# Patient Record
Sex: Male | Born: 1970
Health system: Southern US, Community
[De-identification: ages and names within clinical notes are randomized; demographics above are authoritative.]

## PROBLEM LIST (undated history)

## (undated) DIAGNOSIS — N200 Calculus of kidney: Secondary | ICD-10-CM

## (undated) HISTORY — DX: Calculus of kidney: N20.0

---
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
## 2007-08-24 ENCOUNTER — Ambulatory Visit: Payer: Self-pay | Admitting: Family Medicine

## 2007-12-15 HISTORY — PX: VASECTOMY: SHX75

## 2011-01-22 ENCOUNTER — Encounter: Payer: Self-pay | Admitting: Specialist

## 2011-02-12 ENCOUNTER — Encounter: Payer: Self-pay | Admitting: Specialist

## 2012-12-21 ENCOUNTER — Ambulatory Visit: Payer: Self-pay | Admitting: Specialist

## 2014-09-23 ENCOUNTER — Emergency Department: Payer: Self-pay | Admitting: Emergency Medicine

## 2015-05-15 ENCOUNTER — Encounter: Payer: Self-pay | Admitting: Family Medicine

## 2015-05-15 ENCOUNTER — Ambulatory Visit (INDEPENDENT_AMBULATORY_CARE_PROVIDER_SITE_OTHER): Payer: PRIVATE HEALTH INSURANCE | Admitting: Family Medicine

## 2015-05-15 VITALS — BP 118/78 | HR 83 | Temp 98.5°F | Resp 16 | Wt 197.6 lb

## 2015-05-15 DIAGNOSIS — N200 Calculus of kidney: Secondary | ICD-10-CM | POA: Insufficient documentation

## 2015-05-15 DIAGNOSIS — J029 Acute pharyngitis, unspecified: Secondary | ICD-10-CM | POA: Diagnosis not present

## 2015-05-15 DIAGNOSIS — G4700 Insomnia, unspecified: Secondary | ICD-10-CM | POA: Insufficient documentation

## 2015-05-15 DIAGNOSIS — D179 Benign lipomatous neoplasm, unspecified: Secondary | ICD-10-CM

## 2015-05-15 DIAGNOSIS — T148XXA Other injury of unspecified body region, initial encounter: Secondary | ICD-10-CM | POA: Insufficient documentation

## 2015-05-15 LAB — POCT RAPID STREP A (OFFICE): Rapid Strep A Screen: NEGATIVE

## 2015-05-15 NOTE — Progress Notes (Signed)
   Patient: Johnny Colon, Male    DOB: 11-15-1971, 44 y.o.   MRN: [id number] Visit Date: 05/15/2015  Today's Provider: Lelon Huh, MD   Chief Complaint  Patient presents with  . Sore Throat    yesterday am  . Mass    left arm, 2-3 weeks ago   Subjective:    Sore Throat  This is a new problem. The current episode started yesterday. The problem has been gradually worsening. The pain is worse on the left side. There has been no fever. The pain is mild. Associated symptoms include neck pain, swollen glands and vomiting (now resolved). Pertinent negatives include no abdominal pain, congestion, coughing, diarrhea, ear pain, headaches, hoarse voice or shortness of breath.   Lump on arm He states he hit his left arm on object a few weeks ago. Has been a little sore since then, but knot has formed in area.     Previous Medications   RANITIDINE (ZANTAC) 150 MG TABLET    Take 1 tablet by mouth 2 (two) times daily.   ZOLPIDEM (AMBIEN) 10 MG TABLET    Take 1 tablet by mouth at bedtime. As needed for Insomnia    Review of Systems  HENT: Negative for congestion, ear pain and hoarse voice.   Respiratory: Negative for cough and shortness of breath.   Gastrointestinal: Positive for vomiting (now resolved). Negative for abdominal pain and diarrhea.  Musculoskeletal: Positive for neck pain.  Neurological: Negative for headaches.    History  Substance Use Topics  . Smoking status: Former Smoker    Types: Cigarettes  . Smokeless tobacco: Not on file  . Alcohol Use: Yes     Comment: occasional use   Objective:    Filed Vitals:   05/15/15 1508  BP: 118/78  Pulse: 83  Temp: 98.5 F (36.9 C)  TempSrc: Oral  Resp: 16  Weight: 197 lb 9.6 oz (89.631 kg)  SpO2: 97%   Physical Exam  Constitutional: He appears well-developed and well-nourished.  HENT:  Nose: No rhinorrhea, sinus tenderness or nasal deformity. No epistaxis. Right sinus exhibits no maxillary sinus tenderness and no  frontal sinus tenderness. Left sinus exhibits no maxillary sinus tenderness and no frontal sinus tenderness.  Mouth/Throat: Posterior oropharyngeal erythema (left more than right) present. No oropharyngeal exudate, posterior oropharyngeal edema or tonsillar abscesses.   Results for orders placed or performed in visit on 05/15/15  POCT rapid strep A  Result Value Ref Range   Rapid Strep A Screen Negative Negative        Assessment & Plan:     1. Sore throat  Likely viral. Recommend salt water gargles and OTC ibuprofen as needed.  - POCT rapid strep A - Culture, Group A Strep

## 2015-05-16 ENCOUNTER — Encounter: Payer: Self-pay | Admitting: Family Medicine

## 2015-05-17 ENCOUNTER — Telehealth: Payer: Self-pay

## 2015-05-17 LAB — CULTURE, GROUP A STREP: Strep A Culture: NEGATIVE

## 2015-05-17 NOTE — Telephone Encounter (Signed)
Advised patient that he can stop taking antibiotic due to culture being negative.

## 2015-05-17 NOTE — Telephone Encounter (Signed)
-----   Message from Birdie Sons, MD sent at 05/17/2015 12:36 PM EDT ----- Please advise patient that strep culture is negative and there is no need to take an antibiotic

## 2015-05-30 ENCOUNTER — Telehealth: Payer: Self-pay | Admitting: Family Medicine

## 2015-05-30 NOTE — Telephone Encounter (Signed)
Pt is requesting a Rx to help with chest congestion if possible due to seeing you last week.  [employer].  [phone]

## 2015-05-30 NOTE — Telephone Encounter (Signed)
Please advise 

## 2015-06-02 NOTE — Telephone Encounter (Signed)
Best thing for chest congestion is OTC mucinex. If cough is getting worse, having any fever, or shortness of breath then need to return for follow up.

## 2015-06-03 NOTE — Telephone Encounter (Signed)
Pt advised as directed below.  He reports going [facility] over the weekend and they prescribed them a Prednisone tape.  I advised him to give [place] a call if he does not improve.  Thanks,   -Mickel Baas

## 2015-08-22 ENCOUNTER — Ambulatory Visit (INDEPENDENT_AMBULATORY_CARE_PROVIDER_SITE_OTHER): Payer: PRIVATE HEALTH INSURANCE | Admitting: Family Medicine

## 2015-08-22 ENCOUNTER — Other Ambulatory Visit: Payer: Self-pay | Admitting: Family Medicine

## 2015-08-22 ENCOUNTER — Encounter: Payer: Self-pay | Admitting: Family Medicine

## 2015-08-22 ENCOUNTER — Ambulatory Visit
Admission: RE | Admit: 2015-08-22 | Discharge: 2015-08-22 | Disposition: A | Discharge status: Home / Self Care | Payer: 59 | Source: Ambulatory Visit | Attending: Family Medicine | Admitting: Family Medicine

## 2015-08-22 ENCOUNTER — Telehealth: Payer: Self-pay

## 2015-08-22 VITALS — BP 120/70 | HR 71 | Temp 98.7°F | Resp 16 | Wt 197.0 lb

## 2015-08-22 DIAGNOSIS — T148XXA Other injury of unspecified body region, initial encounter: Secondary | ICD-10-CM

## 2015-08-22 DIAGNOSIS — R0781 Pleurodynia: Secondary | ICD-10-CM | POA: Diagnosis not present

## 2015-08-22 MED ORDER — DICLOFENAC SODIUM 50 MG PO TBEC: 50.0000 mg | DELAYED_RELEASE_TABLET | Freq: Three times a day (TID) | ORAL | Status: DC | PRN

## 2015-08-22 NOTE — Progress Notes (Signed)
       Patient: Johnny Colon Male    DOB: 03-Dec-1971   44 y.o.   MRN: [id number] Visit Date: 08/22/2015  Today's Provider: Lelon Huh, MD   Chief Complaint  Patient presents with  . Rib Injury   Subjective:    HPI  Pain in left side since Tuesday 08/20/2015. Has sob, he said that it hurts to breath in. No injury. Had rough some rough play Monday (08/19/2015) afternoon.  Allergies  Allergen Reactions  . Penicillins Anaphylaxis   Previous Medications   RANITIDINE (ZANTAC) 150 MG TABLET    Take 1 tablet by mouth 2 (two) times daily.   ZOLPIDEM (AMBIEN) 10 MG TABLET    Take 1 tablet by mouth at bedtime. As needed for Insomnia    Review of Systems  Respiratory: Positive for chest tightness and shortness of breath.   Cardiovascular: Positive for chest pain. Negative for palpitations.  Musculoskeletal: Positive for back pain.  Neurological: Positive for dizziness. Negative for headaches.    Social History  Substance Use Topics  . Smoking status: Former Smoker    Types: Cigarettes  . Smokeless tobacco: Not on file  . Alcohol Use: Yes     Comment: occasional use   Objective:   BP 120/70 mmHg  Pulse 71  Temp(Src) 98.7 F (37.1 C) (Oral)  Resp 16  Wt 197 lb (89.359 kg)  SpO2 97%  Physical Exam  Moderately tender left lateral lower ribs. Slight swelling. Lungs CTA. No discoloration XRay negative.      Assessment & Plan:     1. Rib pain on left side  - DG Ribs Unilateral Left; Future  Sx x 3 days with no sign of fracture on xrays. Likely intercostal muscle strain. Continue intermittent ice for the next 24 hours. Call in rx diclofenac. Call if not steadily improving.       Lelon Huh, MD  [facility]

## 2015-08-22 NOTE — Telephone Encounter (Signed)
Patient advised as below and verbally voiced understanding. Prescription sent into pharmacy.

## 2015-08-22 NOTE — Telephone Encounter (Signed)
-----   Message from Birdie Sons, MD sent at 08/22/2015  2:16 PM EDT ----- Regarding: Rib Xray I don't see any rib fractures. Most likely strain of tear of intercostal muscles. Recommend rx for diclofenac 50mg  one tablet every eight hours as needed for pain, #30 rf x 0.   and apply ice every 4-6 hours today and tomorrow.  Should have report from radiologist by tomorrow.

## 2015-08-22 NOTE — Telephone Encounter (Signed)
Spoke with Dr. Caryn Section. Prescription has been sent into pharmacy to help with pain.

## 2015-08-22 NOTE — Telephone Encounter (Signed)
Pt was seen this morning with Dr. Caryn Section and was waiting on the xray results. Pt is asking for something to help the pain of his possible broken rib since Dr. Caryn Section is out of the office this afternoon. Please advise. Thanks TNP

## 2015-08-23 ENCOUNTER — Telehealth: Payer: Self-pay

## 2015-08-23 MED ORDER — HYDROCODONE-ACETAMINOPHEN 7.5-325 MG PO TABS: 1.0000 | ORAL_TABLET | Freq: Four times a day (QID) | ORAL | Status: DC | PRN

## 2015-08-23 NOTE — Telephone Encounter (Signed)
No fracture. Pain should steadily improve over the weekend, but may take about 2 weeks to completely resolve.

## 2015-08-23 NOTE — Telephone Encounter (Signed)
Patient called back wanting to know if we received the report from imaging from what he understood yesterday we were still waiting for that? Please review, patient also wants to know how long this pain will last, thank you-aa

## 2015-08-23 NOTE — Telephone Encounter (Signed)
rx for diclofenac was sent to pharrmacy earlier today. Can also have rx for hydrocodone which has been printed up, but he has to pick up prescription, and should not take this during the day because it causes drowsiness.

## 2015-08-23 NOTE — Telephone Encounter (Signed)
Patient notified

## 2015-08-23 NOTE — Telephone Encounter (Signed)
Patient notified. Patient requested an rx to help with pain?

## 2015-08-27 ENCOUNTER — Other Ambulatory Visit: Payer: Self-pay | Admitting: Family Medicine

## 2015-08-27 NOTE — Telephone Encounter (Signed)
Rx called in to pharmacy. 

## 2015-08-27 NOTE — Telephone Encounter (Signed)
Please call in zolpidem  

## 2015-12-22 ENCOUNTER — Other Ambulatory Visit: Payer: Self-pay | Admitting: Family Medicine

## 2015-12-23 ENCOUNTER — Other Ambulatory Visit: Payer: Self-pay | Admitting: Family Medicine

## 2015-12-23 NOTE — Telephone Encounter (Signed)
Please call in zolpidem  

## 2015-12-24 NOTE — Telephone Encounter (Signed)
Rx called in to pharmacy. 

## 2016-05-12 ENCOUNTER — Other Ambulatory Visit: Payer: Self-pay | Admitting: Family Medicine

## 2016-05-12 NOTE — Telephone Encounter (Signed)
Rx called in to pharmacy. 

## 2016-05-12 NOTE — Telephone Encounter (Signed)
Please call in zolpidem  

## 2016-07-07 ENCOUNTER — Other Ambulatory Visit: Payer: Self-pay | Admitting: Family Medicine

## 2016-07-07 NOTE — Telephone Encounter (Signed)
Please call in zolpidem  

## 2016-09-01 ENCOUNTER — Other Ambulatory Visit: Payer: Self-pay | Admitting: Family Medicine

## 2016-09-01 NOTE — Telephone Encounter (Signed)
Please call in zolpidem  

## 2016-09-01 NOTE — Telephone Encounter (Signed)
Rx called in to pharmacy. 

## 2016-11-19 ENCOUNTER — Other Ambulatory Visit: Payer: Self-pay | Admitting: Internal Medicine

## 2016-11-19 DIAGNOSIS — R937 Abnormal findings on diagnostic imaging of other parts of musculoskeletal system: Secondary | ICD-10-CM

## 2016-11-19 DIAGNOSIS — M5489 Other dorsalgia: Secondary | ICD-10-CM

## 2016-11-28 ENCOUNTER — Ambulatory Visit: Payer: Commercial Managed Care - HMO

## 2016-12-16 DIAGNOSIS — M5412 Radiculopathy, cervical region: Secondary | ICD-10-CM | POA: Diagnosis not present

## 2016-12-16 DIAGNOSIS — R937 Abnormal findings on diagnostic imaging of other parts of musculoskeletal system: Secondary | ICD-10-CM | POA: Diagnosis not present

## 2016-12-21 DIAGNOSIS — G8929 Other chronic pain: Secondary | ICD-10-CM | POA: Diagnosis not present

## 2016-12-21 DIAGNOSIS — M5030 Other cervical disc degeneration, unspecified cervical region: Secondary | ICD-10-CM | POA: Diagnosis not present

## 2016-12-21 DIAGNOSIS — M545 Low back pain: Secondary | ICD-10-CM | POA: Diagnosis not present

## 2016-12-21 DIAGNOSIS — R937 Abnormal findings on diagnostic imaging of other parts of musculoskeletal system: Secondary | ICD-10-CM | POA: Diagnosis not present

## 2016-12-22 DIAGNOSIS — M5412 Radiculopathy, cervical region: Secondary | ICD-10-CM | POA: Diagnosis not present

## 2016-12-22 DIAGNOSIS — M533 Sacrococcygeal disorders, not elsewhere classified: Secondary | ICD-10-CM | POA: Diagnosis not present

## 2017-01-04 DIAGNOSIS — J069 Acute upper respiratory infection, unspecified: Secondary | ICD-10-CM | POA: Diagnosis not present

## 2017-01-08 DIAGNOSIS — M5030 Other cervical disc degeneration, unspecified cervical region: Secondary | ICD-10-CM | POA: Diagnosis not present

## 2017-01-14 ENCOUNTER — Other Ambulatory Visit: Payer: Self-pay | Admitting: Family Medicine

## 2017-01-14 NOTE — Telephone Encounter (Signed)
Rx called in to pharmacy. 

## 2017-01-14 NOTE — Telephone Encounter (Signed)
Please call in zolpidem  

## 2017-05-27 ENCOUNTER — Other Ambulatory Visit: Payer: Self-pay | Admitting: Family Medicine

## 2017-05-27 NOTE — Telephone Encounter (Signed)
Please call in zolpidem patient needs follow up office before any additional refills.

## 2017-05-28 NOTE — Telephone Encounter (Signed)
Left detailed message on pt's vm requesting cb to schedule f/u.

## 2017-05-28 NOTE — Telephone Encounter (Signed)
Rx called in to pharmacy. 

## 2017-06-24 ENCOUNTER — Encounter: Payer: Self-pay | Admitting: Family Medicine

## 2017-06-24 ENCOUNTER — Ambulatory Visit (INDEPENDENT_AMBULATORY_CARE_PROVIDER_SITE_OTHER): Payer: 59 | Admitting: Family Medicine

## 2017-06-24 VITALS — BP 112/62 | HR 64 | Temp 97.8°F | Resp 16 | Ht 74.0 in | Wt 203.0 lb

## 2017-06-24 DIAGNOSIS — G4700 Insomnia, unspecified: Secondary | ICD-10-CM

## 2017-06-24 MED ORDER — ZOLPIDEM TARTRATE 10 MG PO TABS: ORAL_TABLET | ORAL | 5 refills | Status: DC

## 2017-06-24 NOTE — Patient Instructions (Addendum)
Try taking between 3-10mg  of melatonin nightly at bedtime to help regulate sleep cycles.     Insomnia Insomnia is a sleep disorder that makes it difficult to fall asleep or to stay asleep. Insomnia can cause tiredness (fatigue), low energy, difficulty concentrating, mood swings, and poor performance at work or school. There are three different ways to classify insomnia:  Difficulty falling asleep.  Difficulty staying asleep.  Waking up too early in the morning.  Any type of insomnia can be long-term (chronic) or short-term (acute). Both are common. Short-term insomnia usually lasts for three months or less. Chronic insomnia occurs at least three times a week for longer than three months. What are the causes? Insomnia may be caused by another condition, situation, or substance, such as:  Anxiety.  Certain medicines.  Gastroesophageal reflux disease (GERD) or other gastrointestinal conditions.  Asthma or other breathing conditions.  Restless legs syndrome, sleep apnea, or other sleep disorders.  Chronic pain.  Menopause. This may include hot flashes.  Stroke.  Abuse of alcohol, tobacco, or illegal drugs.  Depression.  Caffeine.  Neurological disorders, such as Alzheimer disease.  An overactive thyroid (hyperthyroidism).  The cause of insomnia may not be known. What increases the risk? Risk factors for insomnia include:  Gender. Women are more commonly affected than men.  Age. Insomnia is more common as you get older.  Stress. This may involve your professional or personal life.  Income. Insomnia is more common in people with lower income.  Lack of exercise.  Irregular work schedule or night shifts.  Traveling between different time zones.  What are the signs or symptoms? If you have insomnia, trouble falling asleep or trouble staying asleep is the main symptom. This may lead to other symptoms, such as:  Feeling fatigued.  Feeling nervous about going  to sleep.  Not feeling rested in the morning.  Having trouble concentrating.  Feeling irritable, anxious, or depressed.  How is this treated? Treatment for insomnia depends on the cause. If your insomnia is caused by an underlying condition, treatment will focus on addressing the condition. Treatment may also include:  Medicines to help you sleep.  Counseling or therapy.  Lifestyle adjustments.  Follow these instructions at home:  Take medicines only as directed by your health care provider.  Keep regular sleeping and waking hours. Avoid naps.  Keep a sleep diary to help you and your health care provider figure out what could be causing your insomnia. Include: ? When you sleep. ? When you wake up during the night. ? How well you sleep. ? How rested you feel the next day. ? Any side effects of medicines you are taking. ? What you eat and drink.  Make your bedroom a comfortable place where it is easy to fall asleep: ? Put up shades or special blackout curtains to block light from outside. ? Use a white noise machine to block noise. ? Keep the temperature cool.  Exercise regularly as directed by your health care provider. Avoid exercising right before bedtime.  Use relaxation techniques to manage stress. Ask your health care provider to suggest some techniques that may work well for you. These may include: ? Breathing exercises. ? Routines to release muscle tension. ? Visualizing peaceful scenes.  Cut back on alcohol, caffeinated beverages, and cigarettes, especially close to bedtime. These can disrupt your sleep.  Do not overeat or eat spicy foods right before bedtime. This can lead to digestive discomfort that can make it hard for you  to sleep.  Limit screen use before bedtime. This includes: ? Watching TV. ? Using your smartphone, tablet, and computer.  Stick to a routine. This can help you fall asleep faster. Try to do a quiet activity, brush your teeth, and go to  bed at the same time each night.  Get out of bed if you are still awake after 15 minutes of trying to sleep. Keep the lights down, but try reading or doing a quiet activity. When you feel sleepy, go back to bed.  Make sure that you drive carefully. Avoid driving if you feel very sleepy.  Keep all follow-up appointments as directed by your health care provider. This is important. Contact a health care provider if:  You are tired throughout the day or have trouble in your daily routine due to sleepiness.  You continue to have sleep problems or your sleep problems get worse. Get help right away if:  You have serious thoughts about hurting yourself or someone else. This information is not intended to replace advice given to you by your health care provider. Make sure you discuss any questions you have with your health care provider. Document Released: 11/27/2000 Document Revised: 05/01/2016 Document Reviewed: 08/31/2014 Elsevier Interactive Patient Education  [employer].

## 2017-06-24 NOTE — Progress Notes (Signed)
       Patient: Johnny Colon Male    DOB: 1971-09-04   46 y.o.   MRN: [id number] Visit Date: 06/24/2017  Today's Provider: Lelon Huh, MD   Chief Complaint  Patient presents with  . Insomnia   Subjective:    HPI Insomnia, follow up: Patient was last seen in the office 1 year ago. No changes were made in his medications. He is currently taking Zolpidem 10mg  at bedtime. He reports good symptom control. He sleeps on average 7 hours a night, and feels well rested.     Allergies  Allergen Reactions  . Penicillins Anaphylaxis     Current Outpatient Prescriptions:  .  zolpidem (AMBIEN) 10 MG tablet, TAKE 1 TABLET BY MOUTH EVERY NIGHT AT BEDTIME AS NEEDED FOR INSOMNIA, Disp: 30 tablet, Rfl: 0 .  diclofenac (VOLTAREN) 50 MG EC tablet, Take 1 tablet (50 mg total) by mouth every 8 (eight) hours as needed (for pain). (Patient not taking: Reported on 06/24/2017), Disp: 30 tablet, Rfl: 0 .  HYDROcodone-acetaminophen (NORCO) 7.5-325 MG per tablet, Take 1 tablet by mouth every 6 (six) hours as needed for moderate pain. (Patient not taking: Reported on 06/24/2017), Disp: 12 tablet, Rfl: 0 .  ranitidine (ZANTAC) 150 MG tablet, Take 1 tablet by mouth 2 (two) times daily., Disp: , Rfl:   Review of Systems  Constitutional: Negative.   Respiratory: Negative.   Cardiovascular: Negative.   Neurological: Negative.   Psychiatric/Behavioral: Negative.     Social History  Substance Use Topics  . Smoking status: Former Smoker    Types: Cigarettes  . Smokeless tobacco: Never Used  . Alcohol use Yes     Comment: occasional use   Objective:   BP 112/62 (BP Location: Left Arm, Patient Position: Sitting, Cuff Size: Normal)   Pulse 64   Temp 97.8 F (36.6 C)   Resp 16   Ht 6\' 2"  (1.88 m)   Wt 203 lb (92.1 kg)   SpO2 99%   BMI 26.06 kg/m  Vitals:   06/24/17 0945  BP: 112/62  Pulse: 64  Resp: 16  Temp: 97.8 F (36.6 C)  SpO2: 99%  Weight: 203 lb (92.1 kg)  Height: 6\' 2"  (1.88 m)       Physical Exam  General appearance: alert, well developed, well nourished, cooperative and in no distress Head: Normocephalic, without obvious abnormality, atraumatic Respiratory: Respirations even and unlabored, normal respiratory rate Extremities: No gross deformities Skin: Skin color, texture, turgor normal. No rashes seen  Psych: Appropriate mood and affect. Neurologic: Mental status: Alert, oriented to person, place, and time, thought content appropriate.      Assessment & Plan:     1. Insomnia, unspecified type Doing well with Ambien, although not as effective as in the past. He cn also try OTC melatonin. He declined change of prescription medications today.  - zolpidem (AMBIEN) 10 MG tablet; TAKE 1 TABLET BY MOUTH EVERY NIGHT AT BEDTIME AS NEEDED FOR INSOMNIA  Dispense: 30 tablet; Refill: 5      The entirety of the information documented in the History of Present Illness, Review of Systems and Physical Exam were personally obtained by me. Portions of this information were initially documented by Wilburt Finlay, CMA and reviewed by me for thoroughness and accuracy.    Lelon Huh, MD  [facility]

## 2017-12-03 ENCOUNTER — Other Ambulatory Visit: Payer: Self-pay | Admitting: Family Medicine

## 2017-12-03 DIAGNOSIS — G4700 Insomnia, unspecified: Secondary | ICD-10-CM

## 2017-12-23 DIAGNOSIS — M9901 Segmental and somatic dysfunction of cervical region: Secondary | ICD-10-CM | POA: Diagnosis not present

## 2017-12-23 DIAGNOSIS — M62830 Muscle spasm of back: Secondary | ICD-10-CM | POA: Diagnosis not present

## 2017-12-23 DIAGNOSIS — M9903 Segmental and somatic dysfunction of lumbar region: Secondary | ICD-10-CM | POA: Diagnosis not present

## 2018-02-23 ENCOUNTER — Other Ambulatory Visit: Payer: Self-pay | Admitting: Family Medicine

## 2018-02-23 DIAGNOSIS — G4700 Insomnia, unspecified: Secondary | ICD-10-CM

## 2018-04-12 DIAGNOSIS — M25551 Pain in right hip: Secondary | ICD-10-CM | POA: Diagnosis not present

## 2018-06-11 ENCOUNTER — Other Ambulatory Visit: Payer: Self-pay | Admitting: Family Medicine

## 2018-06-11 DIAGNOSIS — G4700 Insomnia, unspecified: Secondary | ICD-10-CM

## 2018-07-11 ENCOUNTER — Other Ambulatory Visit: Payer: Self-pay | Admitting: Family Medicine

## 2018-07-11 DIAGNOSIS — G4700 Insomnia, unspecified: Secondary | ICD-10-CM

## 2018-08-23 DIAGNOSIS — M62830 Muscle spasm of back: Secondary | ICD-10-CM | POA: Diagnosis not present

## 2018-08-23 DIAGNOSIS — M9901 Segmental and somatic dysfunction of cervical region: Secondary | ICD-10-CM | POA: Diagnosis not present

## 2018-08-23 DIAGNOSIS — M9903 Segmental and somatic dysfunction of lumbar region: Secondary | ICD-10-CM | POA: Diagnosis not present

## 2018-08-25 DIAGNOSIS — M533 Sacrococcygeal disorders, not elsewhere classified: Secondary | ICD-10-CM | POA: Diagnosis not present

## 2018-09-26 ENCOUNTER — Other Ambulatory Visit: Payer: Self-pay | Admitting: Physical Medicine and Rehabilitation

## 2018-09-26 DIAGNOSIS — M5441 Lumbago with sciatica, right side: Secondary | ICD-10-CM

## 2018-10-05 ENCOUNTER — Other Ambulatory Visit: Payer: PRIVATE HEALTH INSURANCE

## 2018-10-06 ENCOUNTER — Ambulatory Visit
Admission: RE | Admit: 2018-10-06 | Discharge: 2018-10-06 | Disposition: A | Discharge status: Home / Self Care | Payer: 59 | Source: Ambulatory Visit | Attending: Physical Medicine and Rehabilitation | Admitting: Physical Medicine and Rehabilitation

## 2018-10-06 DIAGNOSIS — M5441 Lumbago with sciatica, right side: Secondary | ICD-10-CM

## 2018-10-06 DIAGNOSIS — M5137 Other intervertebral disc degeneration, lumbosacral region: Secondary | ICD-10-CM | POA: Diagnosis not present

## 2018-10-24 IMAGING — MR MR LUMBAR SPINE W/O CM
4 of 5 series · 23 of 48 positions shown · non-contrast
Comparison: None.

CLINICAL DATA: Chronic low back, bilateral buttock, and right leg
pain for the past 3 years.

EXAM:
MRI LUMBAR SPINE WITHOUT CONTRAST
TECHNIQUE: Multiplanar, multisequence MR imaging of the lumbar spine was
performed. No intravenous contrast was administered.

[Series 3: T2 · sagittal · 4.0mm · 0.44mm/px · 6 of 12 slices shown (1 of 2)]
[im 1/12]
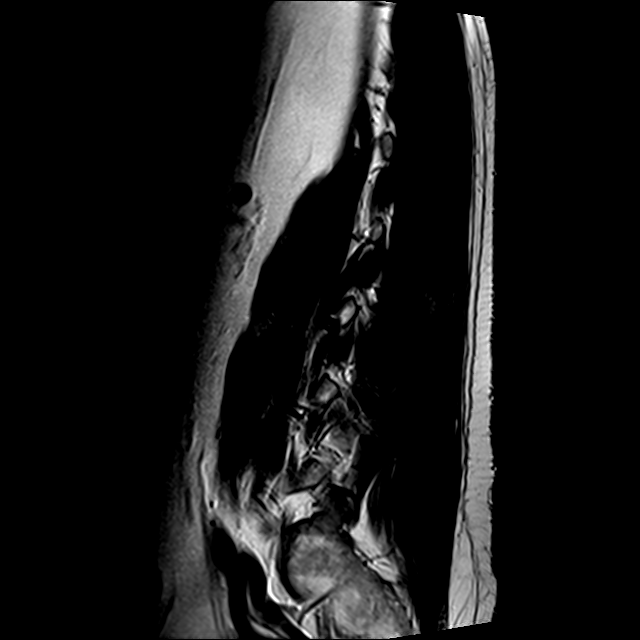
[im 3/12]
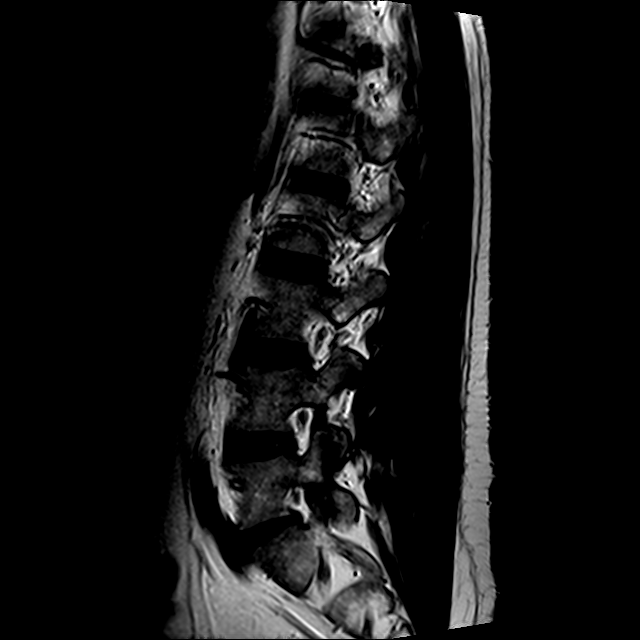
[im 5/12]
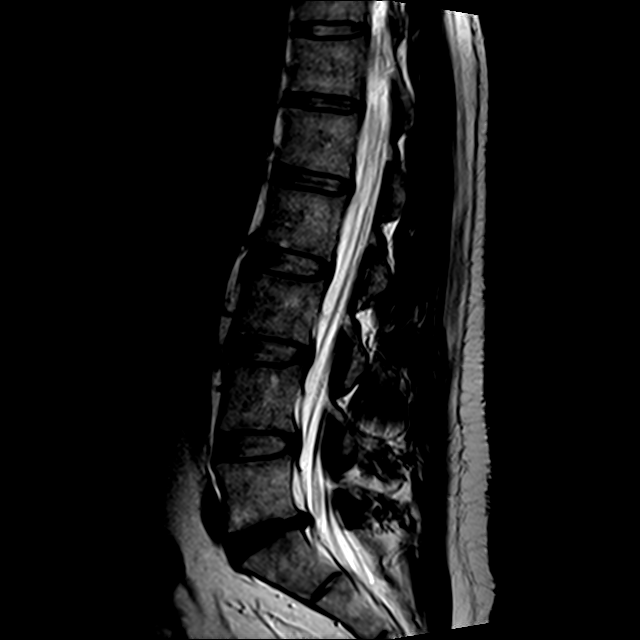
[im 7/12]
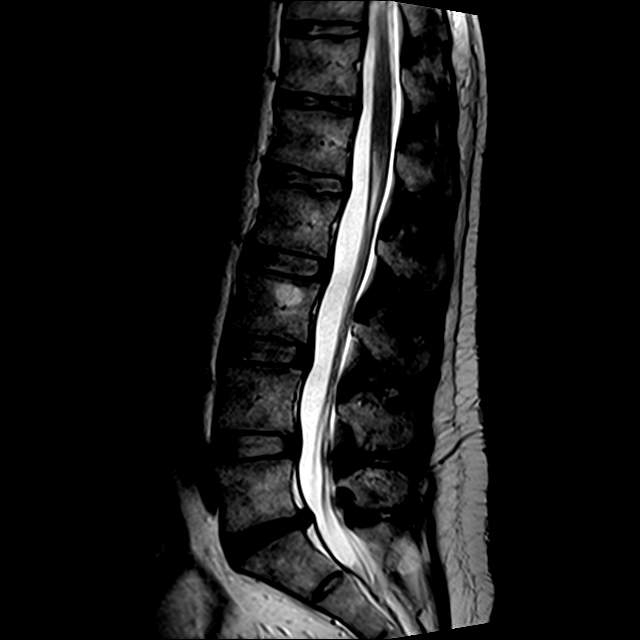
[im 9/12]
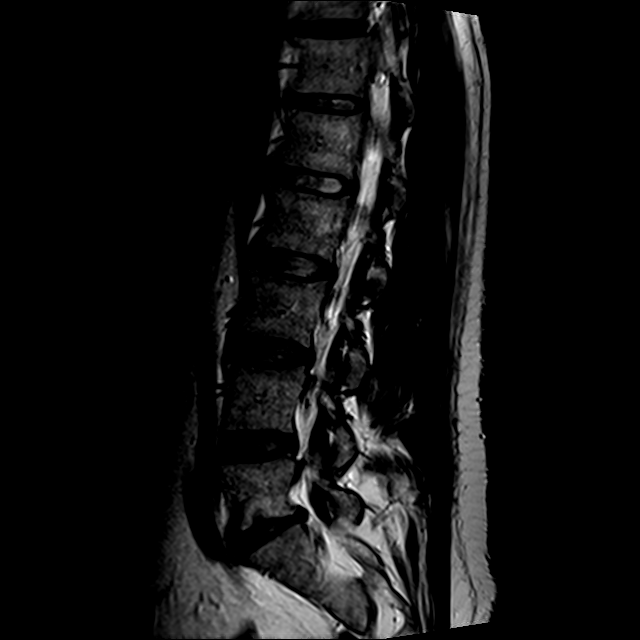
[im 12/12]
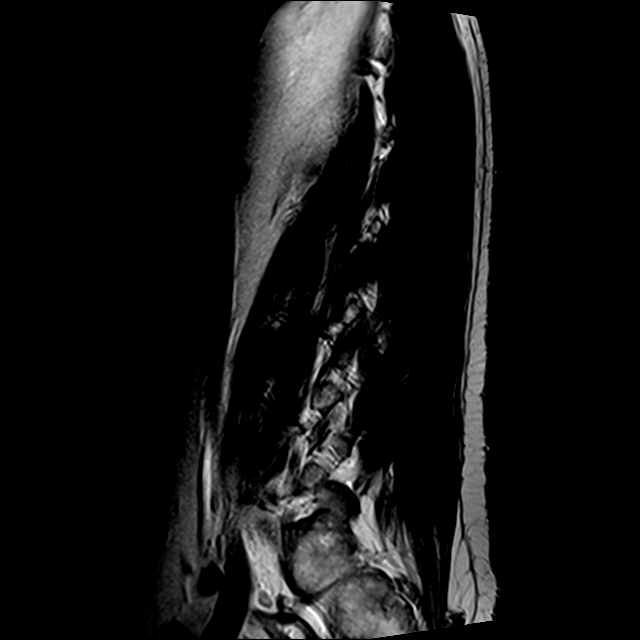

[Series 4: T1 · sagittal · 4.0mm · 0.55mm/px · 4 of 12 slices shown (1 of 2)]
[im 1/12]
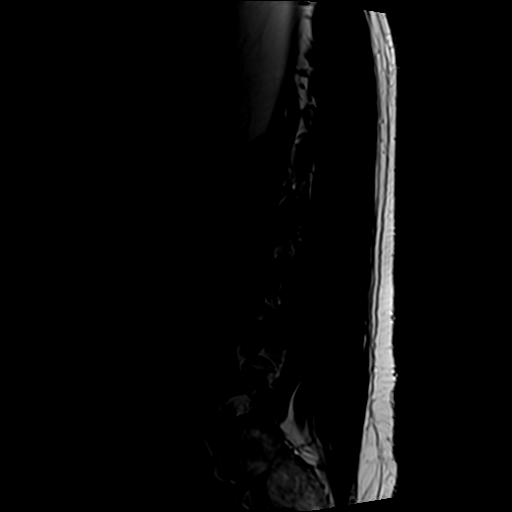
[im 3/12]
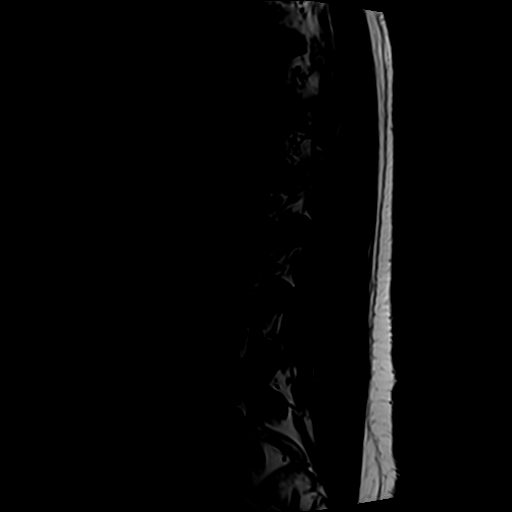
[im 6/12]
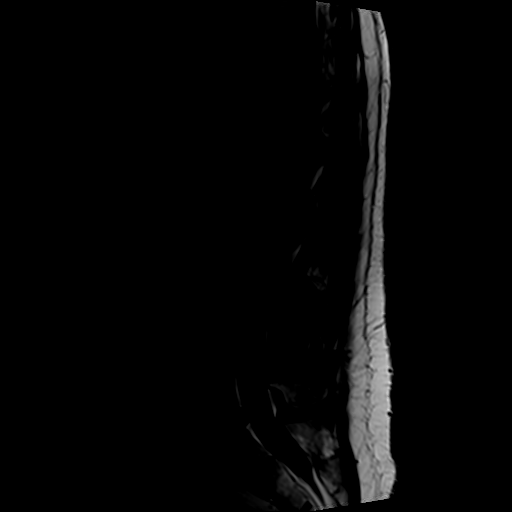
[im 12/12]
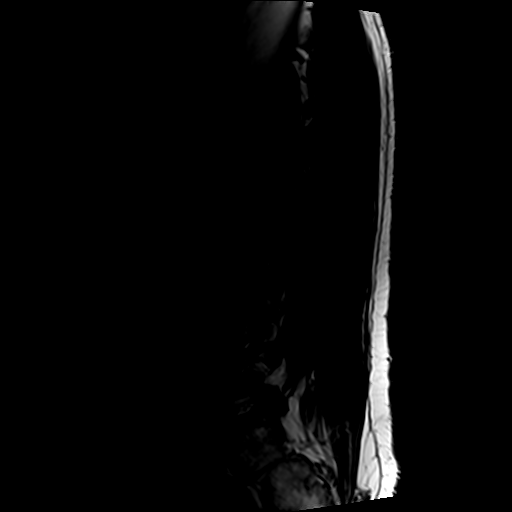

[Series 5: T1 · axial · 4.0mm · 0.39mm/px · z∈[-47,+106]mm · 3 of 35 slices shown (2 of 2)]
[im 5/35]
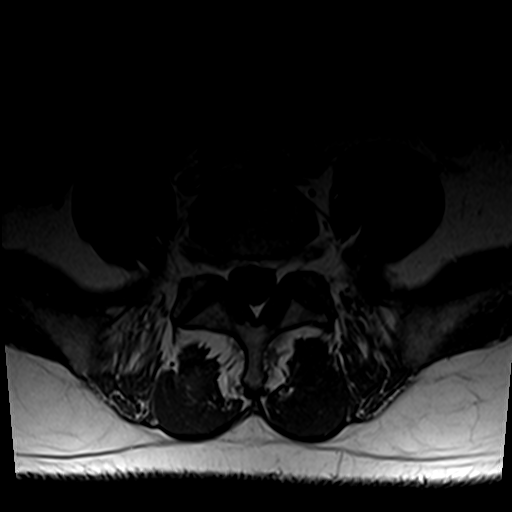
[im 19/35]
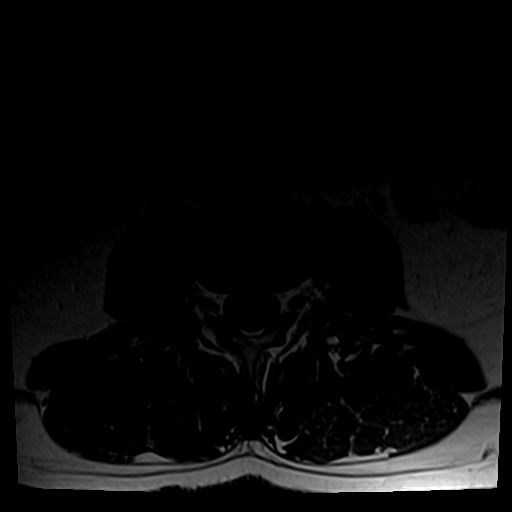
[im 30/35]
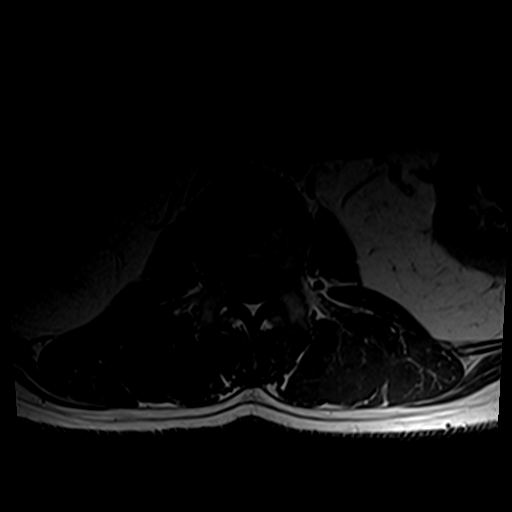

[Series 7: T2 · axial · 4.0mm · 0.78mm/px · z∈[-57,+149]mm · 10 of 35 slices shown (2 of 2)]
[im 3/35]
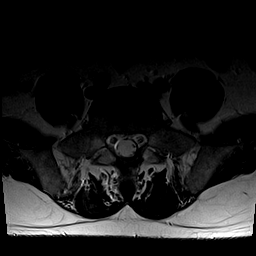
[im 5/35]
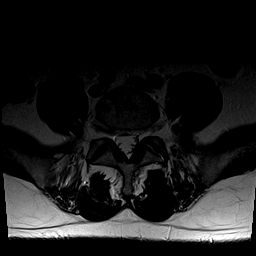
[im 7/35]
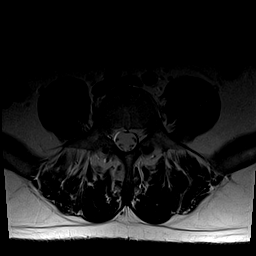
[im 12/35]
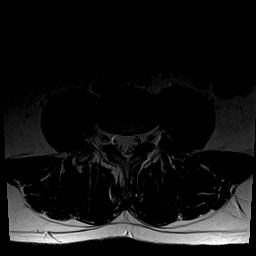
[im 16/35]
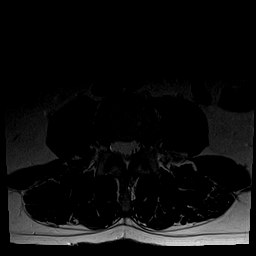
[im 19/35]
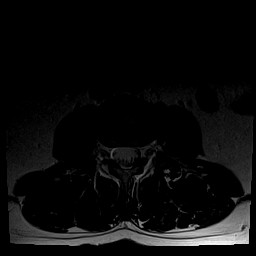
[im 21/35]
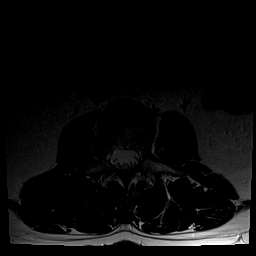
[im 25/35]
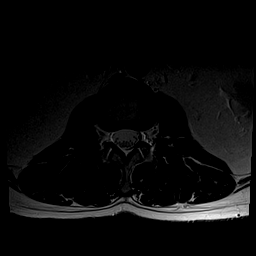
[im 30/35]
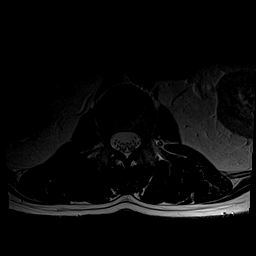
[im 35/35]
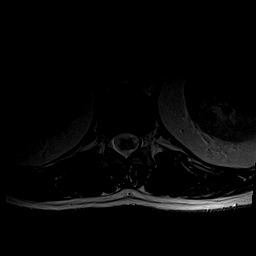

[23 of 48 positions shown; findings below may reference images not displayed]

FINDINGS: Segmentation: Assumed standard. The last well-formed disc space is
designated L5-S1 for the purposes of this report.

Alignment:  Physiologic.

Vertebrae: No fracture, evidence of discitis, or suspicious bone
lesion. Small hemangioma in the L3 vertebral body. Degenerative
endplate marrow edema and Schmorl's node along the right aspect of
the L5-S1 disc space.

Conus medullaris and cauda equina: Conus extends to the L1-L2 level.
Conus and cauda equina appear normal.

Paraspinal and other soft tissues: Negative.

Disc levels:

T12-L1 to L4-L5:  Negative.

L5-S1:  Broad-based central disc protrusion.  No stenosis.
IMPRESSION: 1. Mild degenerative disc disease at L5-S1. No spinal canal or
neuroforaminal stenosis at any level.

## 2018-11-02 DIAGNOSIS — J209 Acute bronchitis, unspecified: Secondary | ICD-10-CM | POA: Diagnosis not present

## 2018-12-19 ENCOUNTER — Other Ambulatory Visit: Payer: Self-pay | Admitting: Family Medicine

## 2018-12-19 DIAGNOSIS — G4700 Insomnia, unspecified: Secondary | ICD-10-CM

## 2019-01-12 DIAGNOSIS — M9903 Segmental and somatic dysfunction of lumbar region: Secondary | ICD-10-CM | POA: Diagnosis not present

## 2019-01-12 DIAGNOSIS — M62830 Muscle spasm of back: Secondary | ICD-10-CM | POA: Diagnosis not present

## 2019-01-12 DIAGNOSIS — M9901 Segmental and somatic dysfunction of cervical region: Secondary | ICD-10-CM | POA: Diagnosis not present

## 2019-03-18 DIAGNOSIS — R05 Cough: Secondary | ICD-10-CM | POA: Diagnosis not present

## 2019-03-18 DIAGNOSIS — R509 Fever, unspecified: Secondary | ICD-10-CM | POA: Diagnosis not present

## 2019-03-18 DIAGNOSIS — R0602 Shortness of breath: Secondary | ICD-10-CM | POA: Diagnosis not present

## 2019-03-19 ENCOUNTER — Other Ambulatory Visit: Payer: Self-pay

## 2019-03-19 ENCOUNTER — Encounter: Payer: Self-pay | Admitting: Emergency Medicine

## 2019-03-19 ENCOUNTER — Emergency Department
Admission: EM | Admit: 2019-03-19 | Discharge: 2019-03-19 | Disposition: A | Discharge status: Home / Self Care | Payer: 59 | Attending: Student in an Organized Health Care Education/Training Program | Admitting: Student in an Organized Health Care Education/Training Program

## 2019-03-19 ENCOUNTER — Emergency Department: Payer: 59

## 2019-03-19 DIAGNOSIS — Z79899 Other long term (current) drug therapy: Secondary | ICD-10-CM | POA: Diagnosis not present

## 2019-03-19 DIAGNOSIS — Z20828 Contact with and (suspected) exposure to other viral communicable diseases: Secondary | ICD-10-CM | POA: Insufficient documentation

## 2019-03-19 DIAGNOSIS — R05 Cough: Secondary | ICD-10-CM | POA: Diagnosis not present

## 2019-03-19 DIAGNOSIS — Z87891 Personal history of nicotine dependence: Secondary | ICD-10-CM | POA: Insufficient documentation

## 2019-03-19 DIAGNOSIS — R69 Illness, unspecified: Secondary | ICD-10-CM

## 2019-03-19 DIAGNOSIS — J069 Acute upper respiratory infection, unspecified: Secondary | ICD-10-CM | POA: Diagnosis not present

## 2019-03-19 DIAGNOSIS — M791 Myalgia, unspecified site: Secondary | ICD-10-CM | POA: Diagnosis present

## 2019-03-19 DIAGNOSIS — J111 Influenza due to unidentified influenza virus with other respiratory manifestations: Secondary | ICD-10-CM | POA: Insufficient documentation

## 2019-03-19 DIAGNOSIS — B9789 Other viral agents as the cause of diseases classified elsewhere: Secondary | ICD-10-CM | POA: Insufficient documentation

## 2019-03-19 LAB — INFLUENZA PANEL BY PCR (TYPE A & B)
Influenza A By PCR: POSITIVE — AB
Influenza B By PCR: NEGATIVE

## 2019-03-19 MED ORDER — OSELTAMIVIR PHOSPHATE 75 MG PO CAPS: 75.0000 mg | ORAL_CAPSULE | Freq: Two times a day (BID) | ORAL | 0 refills | Status: AC

## 2019-03-19 NOTE — Discharge Instructions (Addendum)
Your exam is consistent with a viral URI, including influenza.   You have a viral illness which can have symptoms like muscle aches, fevers, chills, runny nose, cough, sneezing, sore throat, vomiting or diarrhea. One of the viruses that can cause this is SARS- CoV-2, the virus that causes COVID-19, also known as the novel coronavirus. You could also have a different viral infection such as the common cold or flu. Most patients with viral illness including COVID-19 have mild symptoms and recover on their own. Resting, staying hydrated, and sleeping are helpful. Today we think you are well enough to go home and treat your symptoms with oral liquids, and medicine for fevers, cough, and pain.  We generally do not do COVID-19 testing on people with mild symptoms who are being discharged from the Emergency Department or Clinic.   If we did a test for COVID-19 the results will not be available for several days. We will call you with the result. Please DO NOT CONTACT THE EMERGENCY DEPARTMENT OR CLINIC FOR RESULTS OF THIS TEST.   Please follow the precautions below:  Stay home for at least 7 days after your symptoms began OR for 3 days after your fever ends, whichever takes longer. ?  If people live with you they should also stay home and avoid contact with others for 14 days.?   ISOLATION GUIDANCE FOR POSSIBLE COVID Most people with cough and fever have an illness caused by a virus. One is COVID-19. Not all people with these infections are being tested for the virus that causes COVID-19. People who might have COVID but are not being tested should still try to prevent the spread of the infection.  These instructions are modified recommendations from the [place].  People who might have COVID-19 should follow the instructions below until a doctor or health department says they can stop.  Stay home unless you need to see a doctor Stop doing things outside your  home except for getting medical care. Do not go to work, school, or public areas; and do not use public transportation or taxis.  Call ahead before visiting the doctor Before your appointment, call the doctor's office and tell them about your symptoms. This will help them take steps to keep other people from getting infected.   Keep track of your symptoms Symptoms are the things you feel, like fever or trouble breathing. Go to your doctor or the ER if you think you are getting worse, like having more trouble breathing. Call the doctor's office and tell them about your symptoms. This will help them take steps to keep other people from getting sick.   Wear a face mask You should wear a face mask that covers your nose and mouth when you are in the same room with other people and when you visit the doctor's office. People who live with you or visit you should also wear a face mask when they are in the same room with you.  Separate yourself from other people in your home You should stay in a different room from other people in your home. You should stay separate from your family members as much as possible. You should use a separate bathroom if you can.  Avoid sharing things in your house Don't share dishes, drinking glasses, cups, eating utensils, towels, bedding, or other things with people in your home. After using these things please wash them really well with soap and water.  Cover your coughs  and sneezes Cover your mouth and nose with a tissue when you cough or sneeze, or you can cough or sneeze into your sleeve. Throw used tissues in a trash can that has a bag in it, and immediately wash your hands with soap and water for at least 20 seconds. If you use an alcohol-based hand rub please rub your hands together for 20 seconds.  Wash your [facility] your hands often and very well with soap and water for at least 20 seconds. If your hands are not visibly dirty you can use an alcohol-based hand  sanitizer. Don't touch your eyes, nose, or mouth with unwashed hands.    Instructions for People Helping Care for Patients with Possible COVID-19 Follow your doctor's instructions Make sure that you understand and can help the patient follow any instructions for care.  Provide for the patient's basic needs You should help the patient with basic needs in the home and provide support for getting groceries, prescriptions, and other personal needs.  Keep track of the patient's symptoms If they are getting sicker, call his or her doctor. This will help the doctor's office take steps to keep other people from getting infected.  Limit the number of people who have contact with the patient If possible, have only one caregiver for the patient. Other family members should stay in another home or place of residence. If they can't, they should stay in another room and stay separated from the patient as much as possible. Keep one bathroom JUST for the patient if you can.  Only allow visitors if they MUST be in the home.  Keep older adults, very young children, and other sick people away from the patient Keep older adults, very young children, and people who have compromised immune systems or chronic health conditions away from the patient. This includes people with chronic heart, lung, or kidney conditions, diabetes, and cancer.  Wash your hands often Avoid touching your eyes, nose, and mouth with unwashed hands. Wash your hands often and thoroughly with soap and water for at least 20 seconds. You can use an alcohol-based hand sanitizer if soap and water are not available and if your hands are not visibly dirty.  Use disposable paper towels to dry your hands. If not available, use dedicated cloth towels and replace them when they become wet.  Avoid contamination from face masks and gloves Wear a disposable face mask and gloves whenever you are touching the patient, things in their room or bathroom,  or things that can have their body fluid on them, like bedding or dishes, or blood, vomit, urine, or feces (poop).  Ensure the mask fits over your nose and mouth tightly, and do not touch it at all while you are wearing it. Throw out disposable facemasks and gloves after using them. Do not reuse. Wash your hands immediately after removing your facemask and gloves. If your personal clothing becomes dirty with a patient's body fluids, carefully remove clothing and launder. Wash your hands after handling dirty clothing. Place all used disposable facemasks, gloves, and other waste in a lined container before disposing them with other household garbage.  Remove gloves and wash your hands immediately after handling these items.  Do not share dishes, glasses, or other household items with the patient Avoid sharing household items. You should not share dishes, drinking glasses, cups, eating utensils, towels, bedding, or other items with a patient who is confirmed to have, or being evaluated for, COVID-19 infection.  After the person  uses these items, you should wash them very well with soap and water.  Wash laundry thoroughly Immediately remove and wash clothes or bedding that have blood, body fluids, and/or secretions or excretions, such as sweat, saliva, sputum, nasal mucus, vomit, urine, or feces, on them.  Wear gloves when handling laundry from the patient.  Read and follow directions on labels of laundry or clothing items and detergent. In general, wash and dry with the warmest temperatures recommended on the label.  Clean all areas the individual has used  Clean all touchable surfaces, such as counters, tabletops, doorknobs, bathroom fixtures, toilets, phones, keyboards, tablets, and bedside tables, every day. Also, clean any surfaces that may have blood, body fluids, and/or secretions or excretions on them. Wear gloves when cleaning surfaces the patient has come in contact with.  Use a diluted  bleach solution (dilute bleach with 1 part bleach and 10 parts water) or a household disinfectant with a label that says EPA-registered for coronaviruses. To make a bleach solution at home, add 1 tablespoon of bleach to 1 quart (4 cups) of water. For a larger supply, add  cup of bleach to 1 gallon (16 cups) of water.  Read labels of cleaning products and follow recommendations provided on product labels. Labels contain instructions for safe and effective use of the cleaning product including precautions you should take when applying the product, such as wearing gloves or eye protection and making sure you have good ventilation during use of the product.  Remove gloves and wash hands immediately after cleaning.  Monitor yourself for signs and symptoms of illness Caregivers and household members are considered close contacts, should monitor their health, and will be asked to limit movement outside of the home as much as possible.   If you have additional questions Nutritional therapist or Healthcare Provider [facility] website ([email or web address].pl) The [place] hotline 336-70COVID Your local health department   This guidance is subject to change. For the most up to date guidance, check the state Department of Health website at [email or web address]

## 2019-03-19 NOTE — ED Triage Notes (Signed)
Pt presents to ED c/o cough, body aches, and fever 101-102 at home. Took tylenol approx. 2hrs PTA.

## 2019-03-19 NOTE — ED Provider Notes (Signed)
[facility] Emergency Department Provider Note    First MD Initiated Contact with Patient 03/19/19 1441     (approximate)  I have reviewed the triage vital signs and the nursing notes.   HISTORY  Chief Complaint Cough and Fever    HPI Johnny Colon is a 48 y.o. male plosive past medical history presents the ER for myalgias diarrhea cough and fevers.  Worse night was last night.  Patient was sent for viral testing by PCP.  Patient was traveling to [place] several weeks ago.  Denies any history of chronic lung disease or heart disease.  Nuys any abdominal pain or dysuria at this time.  Not having trouble eating or drinking.  Is primarily concerned for testing due to concern for exposure to coworkers.  Past Medical History:  Diagnosis Date  . Kidney stones    History reviewed. No pertinent family history. Past Surgical History:  Procedure Laterality Date  . NASAL SEPTUM SURGERY  1987   repair of deviated nasal septum  . VASECTOMY  2009   Patient Active Problem List   Diagnosis Date Noted  . Insomnia 05/15/2015  . Chronic infection of sinus 01/07/2009  . Lipoma 08/30/2007  . Headache, tension-type 08/30/2007  . Anxiety disorder 12/15/2003      Prior to Admission medications   Medication Sig Start Date End Date Taking? Authorizing Provider  ranitidine (ZANTAC) 150 MG tablet Take 1 tablet by mouth 2 (two) times daily. 09/20/14   [provider]  zolpidem (AMBIEN) 10 MG tablet TAKE 1 TABLET BY MOUTH EVERY NIGHT AT BEDTIME AS NEEDED FOR INSOMNIA 12/19/18   Birdie Sons, MD    Allergies Penicillins    Social History Social History   Tobacco Use  . Smoking status: Former Smoker    Types: Cigarettes  . Smokeless tobacco: Never Used  Substance Use Topics  . Alcohol use: Yes    Comment: occasional use  . Drug use: No    Review of Systems Patient denies headaches, rhinorrhea, blurry vision, numbness, shortness of breath,  chest pain, edema, cough, abdominal pain, nausea, vomiting, diarrhea, dysuria, fevers, rashes or hallucinations unless otherwise stated above in HPI. ____________________________________________   PHYSICAL EXAM:  VITAL SIGNS: Vitals:   03/19/19 1319  BP: 134/90  Pulse: 100  Resp: 18  Temp: 98.8 F (37.1 C)  SpO2: 96%    Constitutional: Alert and oriented. Well appearing and in no acute distress. Eyes: Conjunctivae are normal.  Head: Atraumatic. Nose: No congestion/rhinnorhea. Mouth/Throat: Mucous membranes are moist.   Neck: Painless ROM.  Cardiovascular:   Good peripheral circulation. Respiratory: Normal respiratory effort.  No retractions.  Gastrointestinal: Soft and nontender. Musculoskeletal: No lower extremity tenderness .  No joint effusions. Neurologic:  Normal speech and language. No gross focal neurologic deficits are appreciated.  Skin:  Skin is warm, dry and intact. No rash noted. Psychiatric: Mood and affect are normal. Speech and behavior are normal.  ____________________________________________   LABS (all labs ordered are listed, but only abnormal results are displayed)  No results found for this or any previous visit (from the past 24 hour(s)). _______________________________________________  RADIOLOGY  I personally reviewed all radiographic images ordered to evaluate for the above acute complaints and reviewed radiology reports and findings.  These findings were personally discussed with the patient.  Please see medical record for radiology report.  ____________________________________________   PROCEDURES  Procedure(s) performed:  Procedures    Critical Care performed: no ____________________________________________   INITIAL IMPRESSION /  ASSESSMENT AND PLAN / ED COURSE  Pertinent labs & imaging results that were available during my care of the patient were reviewed by me and considered in my medical decision making (see chart for details).   DDX: uri, flu, pna, covid 23, viral illness  Johnny Colon is a 48 y.o. who presents to the ED with was as described above.  Currently is afebrile.  No respiratory distress.  Does appear to be suffering from some viral syndrome.  Chest x-ray shows no evidence of consolidation pneumonia or pneumothorax.  Given his recent exposure and travel to COVID-19 hotspot will send for flu test and if negative will add on covid 19.  No indication for hospitalization at this time.  Discussed self quarantine supportive measures and follow-up with PCP.  The patient was evaluated in Emergency Department today for the symptoms described in the history of present illness. He/she was evaluated in the context of the global COVID-19 pandemic, which necessitated consideration that the patient might be at risk for infection with the SARS-CoV-2 virus that causes COVID-19. Institutional protocols and algorithms that pertain to the evaluation of patients at risk for COVID-19 are in a state of rapid change based on information released by regulatory bodies including the CDC and federal and state organizations. These policies and algorithms were followed during the patient's care in the ED.       ____________________________________________   FINAL CLINICAL IMPRESSION(S) / ED DIAGNOSES  Final diagnoses:  Influenza-like illness  Viral URI with cough      NEW MEDICATIONS STARTED DURING THIS VISIT:  New Prescriptions   No medications on file     Note:  This document was prepared using Dragon voice recognition software and may include unintentional dictation errors.     Merlyn Lot, MD 03/19/19 1451

## 2019-03-22 ENCOUNTER — Telehealth: Payer: Self-pay | Admitting: Emergency Medicine

## 2019-03-22 LAB — NOVEL CORONAVIRUS, NAA (HOSP ORDER, SEND-OUT TO REF LAB; TAT 18-24 HRS): SARS-CoV-2, NAA: NOT DETECTED

## 2019-03-22 NOTE — Telephone Encounter (Signed)
Called to assure he has seen covid 19 result in [place] and he has already seen it.

## 2019-04-06 IMAGING — DX PORTABLE CHEST - 1 VIEW
2 series · 2 of 2 positions shown · non-contrast
Comparison: 08/22/2015

CLINICAL DATA: Cough and fever

EXAM:
PORTABLE CHEST 1 VIEW

[chest pa (1 of 2)]
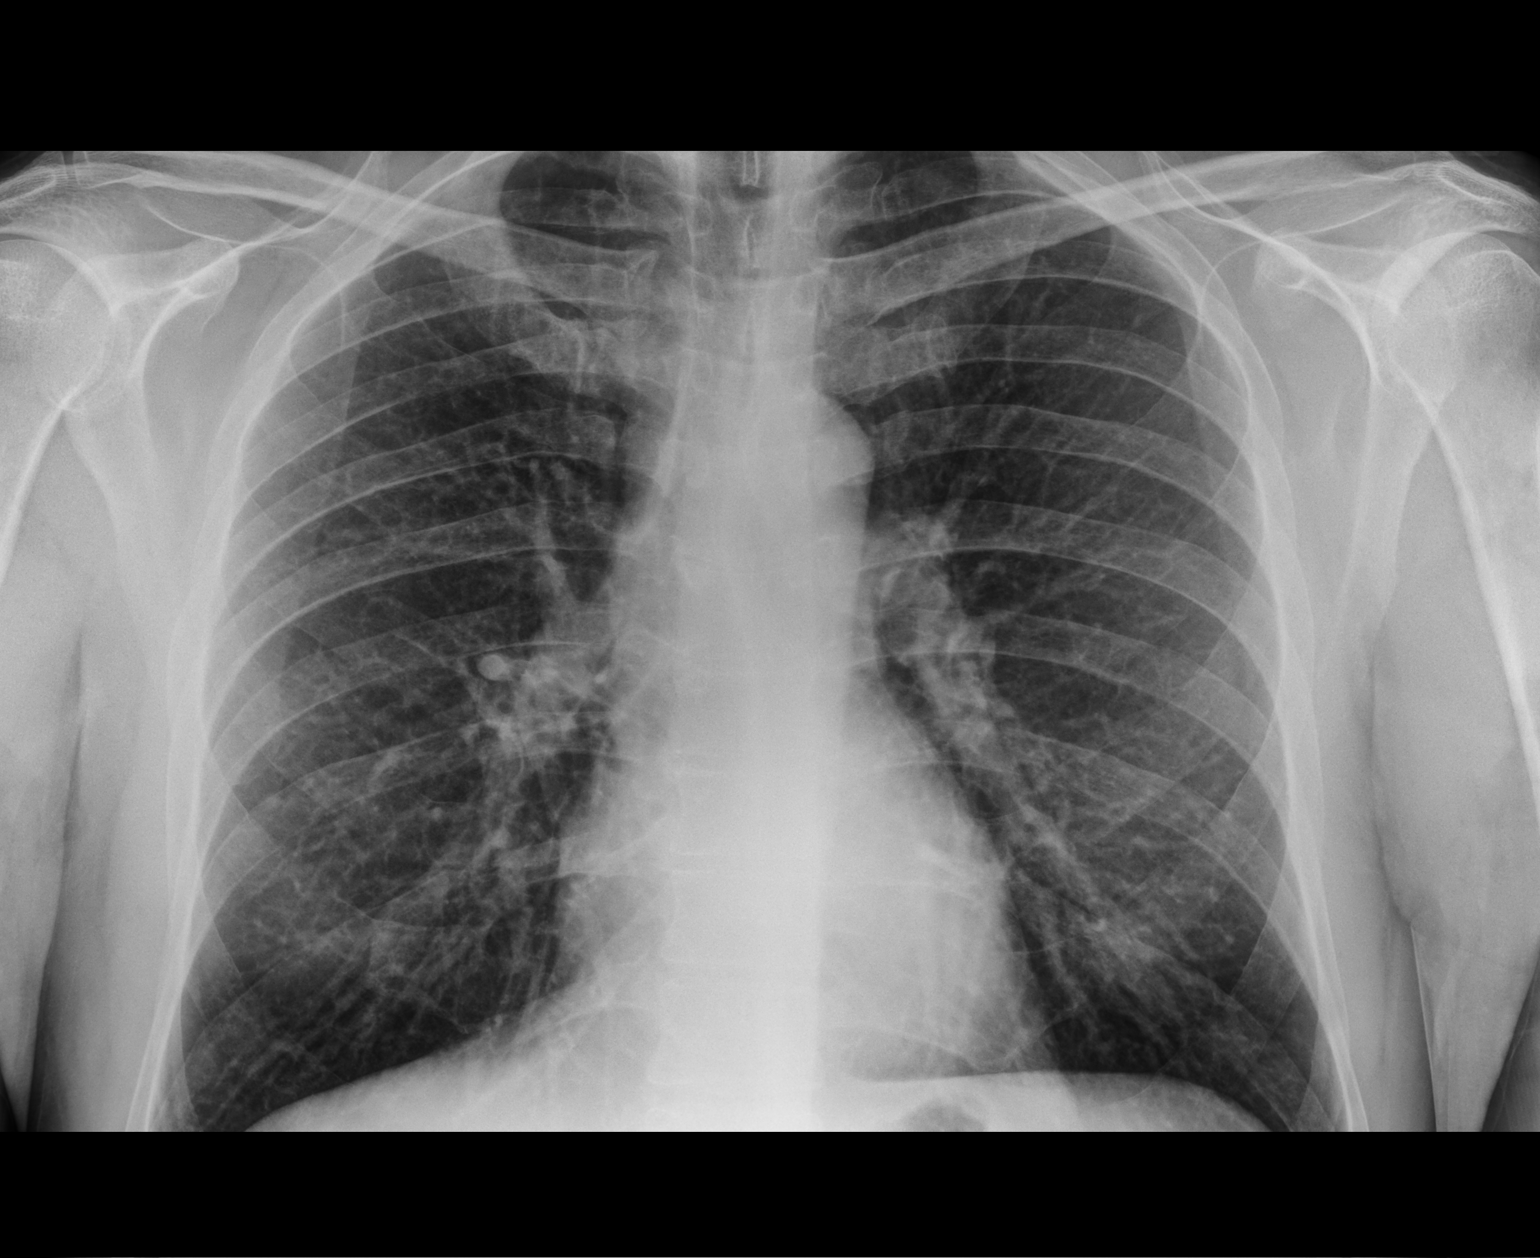

[chest pa (2 of 2)]
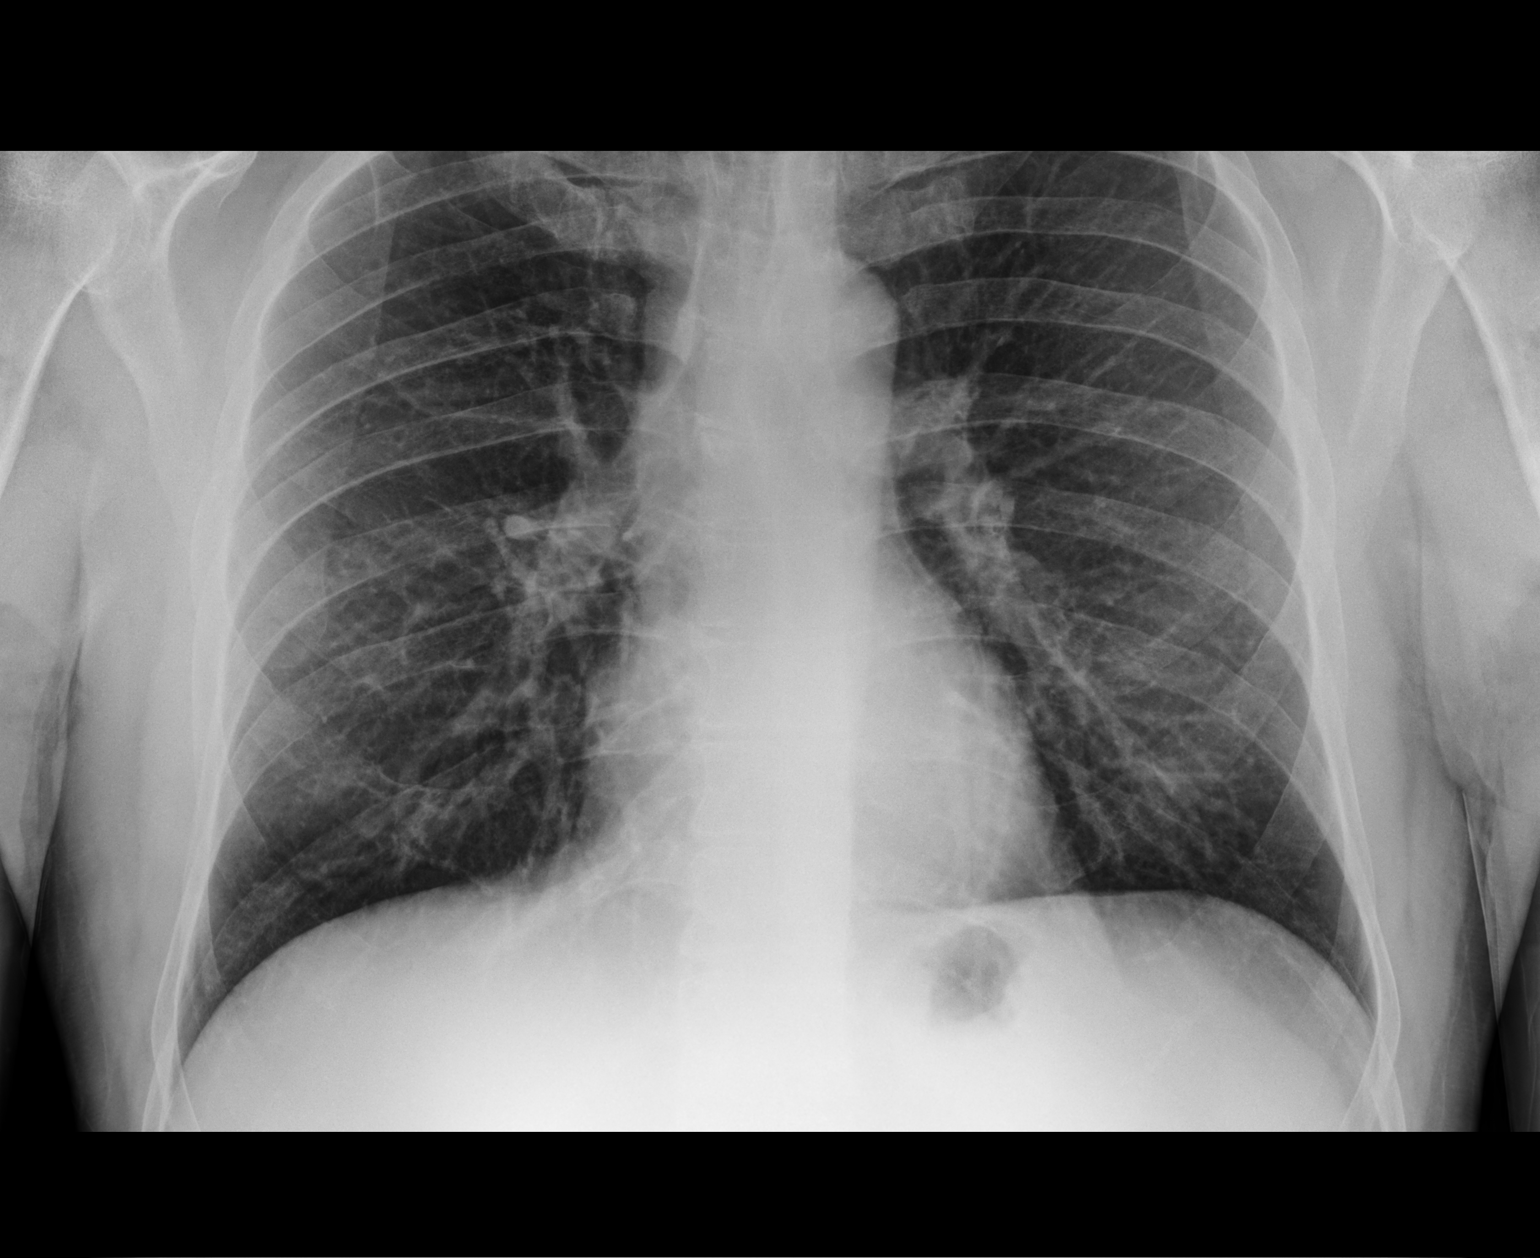

[2 of 2 positions shown; findings below may reference images not displayed]

FINDINGS: The heart size and mediastinal contours are within normal limits.
Both lungs are clear. The visualized skeletal structures are
unremarkable.
IMPRESSION: No active disease.

## 2019-04-07 ENCOUNTER — Other Ambulatory Visit: Payer: Self-pay | Admitting: Family Medicine

## 2019-04-07 DIAGNOSIS — G4700 Insomnia, unspecified: Secondary | ICD-10-CM

## 2019-07-24 ENCOUNTER — Other Ambulatory Visit: Payer: Self-pay | Admitting: Family Medicine

## 2019-07-24 DIAGNOSIS — G4700 Insomnia, unspecified: Secondary | ICD-10-CM

## 2019-09-15 ENCOUNTER — Other Ambulatory Visit: Payer: Self-pay | Admitting: Family Medicine

## 2019-09-15 DIAGNOSIS — G4700 Insomnia, unspecified: Secondary | ICD-10-CM

## 2019-09-15 NOTE — Telephone Encounter (Signed)
Pharmacy requesting refills. Thanks!  

## 2020-02-16 ENCOUNTER — Ambulatory Visit: Payer: 59 | Attending: Internal Medicine

## 2020-02-16 DIAGNOSIS — Z23 Encounter for immunization: Secondary | ICD-10-CM | POA: Insufficient documentation

## 2020-02-16 NOTE — Progress Notes (Signed)
   Covid-19 Vaccination Clinic  Name:  Johnny Colon    MRN: [id number] DOB: 1971/03/18  02/16/2020  Johnny Colon was observed post Covid-19 immunization for 30 minutes based on pre-vaccination screening without incident. He was provided with Vaccine Information Sheet and instruction to access the V-Safe system.   Johnny Colon was instructed to call 911 with any severe reactions post vaccine: Marland Kitchen Difficulty breathing  . Swelling of face and throat  . A fast heartbeat  . A bad rash all over body  . Dizziness and weakness   Immunizations Administered    Name Date Dose VIS Date Route   Pfizer COVID-19 Vaccine 02/16/2020 11:16 AM 0.3 mL 11/24/2019 Intramuscular   Manufacturer: [place]   Lot: [id number]   [place]: [id number]

## 2020-02-27 ENCOUNTER — Other Ambulatory Visit: Payer: Self-pay | Admitting: Family Medicine

## 2020-02-27 DIAGNOSIS — G4700 Insomnia, unspecified: Secondary | ICD-10-CM

## 2020-02-27 NOTE — Telephone Encounter (Signed)
Requested medication (s) are due for refill today: yes  Requested medication (s) are on the active medication list: yes  Last refill:  02/02/20  Future visit scheduled: no  Notes to clinic:  not delegated; no valid encounter in last 6 months   Requested Prescriptions  Pending Prescriptions Disp Refills   zolpidem (AMBIEN) 10 MG tablet [Pharmacy Med Name: ZOLPIDEM 10MG  TABLETS] 30 tablet     Sig: TAKE 1 TABLET BY MOUTH EVERY NIGHT AT BEDTIME AS NEEDED FOR INSOMNIA      Not Delegated - Psychiatry:  Anxiolytics/Hypnotics Failed - 02/27/2020  9:24 AM      Failed - This refill cannot be delegated      Failed - Urine Drug Screen completed in last 360 days.      Failed - Valid encounter within last 6 months    Recent Outpatient Visits           2 years ago Insomnia, unspecified type   [facility] Birdie Sons, MD   4 years ago Rib pain on left side   [facility] Birdie Sons, MD   4 years ago Sore throat   [facility] Birdie Sons, MD

## 2020-02-28 ENCOUNTER — Other Ambulatory Visit: Payer: Self-pay | Admitting: Family Medicine

## 2020-02-28 DIAGNOSIS — G4700 Insomnia, unspecified: Secondary | ICD-10-CM

## 2020-02-28 MED ORDER — ZOLPIDEM TARTRATE 10 MG PO TABS: ORAL_TABLET | ORAL | 1 refills | Status: DC

## 2020-02-28 MED ORDER — ZOLPIDEM TARTRATE 10 MG PO TABS: ORAL_TABLET | ORAL | 0 refills | Status: DC

## 2020-02-28 NOTE — Telephone Encounter (Signed)
Patient has follow up appointment scheduled for 04/02/2020 at 8am. Please advise on refill request. I am not able to send this medication into the pharmacy.

## 2020-02-28 NOTE — Telephone Encounter (Signed)
Medication Refill - Medication: zolpidem (AMBIEN) 10 MG tablet   Has the patient contacted their pharmacy? Yes.   (Agent: If no, request that the patient contact the pharmacy for the refill.) (Agent: If yes, when and what did the pharmacy advise?)  Preferred Pharmacy (with phone number or street name): [facility] [id number], [place] Phone:  [phone]  Fax:  [phone]       Agent: Please be advised that RX refills may take up to 3 business days. We ask that you follow-up with your pharmacy.

## 2020-02-28 NOTE — Telephone Encounter (Signed)
Requested medication (s) are due for refill today: Yes  Requested medication (s) are on the active medication list: Yes  Last refill:  n/a  Future visit scheduled: Yes  Notes to clinic:  Refused yesterday - pt. Has an appointment.    Requested Prescriptions  Pending Prescriptions Disp Refills   zolpidem (AMBIEN) 10 MG tablet 30 tablet 5      Not Delegated - Psychiatry:  Anxiolytics/Hypnotics Failed - 02/28/2020  3:51 PM      Failed - This refill cannot be delegated      Failed - Urine Drug Screen completed in last 360 days.      Failed - Valid encounter within last 6 months    Recent Outpatient Visits           2 years ago Insomnia, unspecified type   [facility] Birdie Sons, MD   4 years ago Rib pain on left side   [facility] Birdie Sons, MD   4 years ago Sore throat   [facility] Birdie Sons, MD       Future Appointments             In 1 month Fisher, Kirstie Peri, MD [facility], [place]

## 2020-03-13 ENCOUNTER — Ambulatory Visit: Payer: 59 | Attending: Internal Medicine

## 2020-03-13 DIAGNOSIS — Z23 Encounter for immunization: Secondary | ICD-10-CM

## 2020-03-13 NOTE — Progress Notes (Signed)
   Covid-19 Vaccination Clinic  Name:  Johnny Colon    MRN: [id number] DOB: Aug 22, 1971  03/13/2020  Mr. Johnny Colon was observed post Covid-19 immunization for 30 minutes based on pre-vaccination screening without incident. He was provided with Vaccine Information Sheet and instruction to access the V-Safe system.   Mr. Johnny Colon was instructed to call 911 with any severe reactions post vaccine: Marland Kitchen Difficulty breathing  . Swelling of face and throat  . A fast heartbeat  . A bad rash all over body  . Dizziness and weakness   Immunizations Administered    Name Date Dose VIS Date Route   Pfizer COVID-19 Vaccine 03/13/2020 12:13 PM 0.3 mL 11/24/2019 Intramuscular   Manufacturer: [employer]   Lot: [id number]   [place]: [id number]

## 2020-04-01 NOTE — Progress Notes (Signed)
     I,Roshena L Chambers,acting as a scribe for Lelon Huh, MD.,have documented all relevant documentation on the behalf of Lelon Huh, MD,as directed by  Lelon Huh, MD while in the presence of Lelon Huh, MD.  Established patient visit     Patient: Johnny Colon   DOB: 1971-06-07   49 y.o. Male  MRN: [id number] Visit Date: 04/02/2020  Today's healthcare provider: Lelon Huh, MD   Chief Complaint  Patient presents with  . Insomnia   Subjective    HPI Follow up for Insomnia:  The patient was last seen for this 06/24/2017.   Changes made at last visit include advising patient to try OTC melatonin along with continuing Ambien.  He reports good compliance with treatment. He has only been taking Ambien.  He feels that condition is Unchanged. He is not having side effects.   -----------------------------------------------------------------------------------------   family history includes Prostate cancer in his paternal grandfather; Prostate cancer (age of onset: 73) in his father.     Medications: Outpatient Medications Prior to Visit  Medication Sig  . meloxicam (MOBIC) 15 MG tablet Take 15 mg by mouth daily.  . metroNIDAZOLE (METROGEL) 0.75 % gel   . traMADol (ULTRAM) 50 MG tablet Take 1 tablet by mouth 2 (two) times daily as needed.  . [DISCONTINUED] zolpidem (AMBIEN) 10 MG tablet TAKE 1 TABLET BY MOUTH EVERY NIGHT AT BEDTIME AS NEEDED FOR INSOMNIA  . [DISCONTINUED] ranitidine (ZANTAC) 150 MG tablet Take 1 tablet by mouth 2 (two) times daily.   No facility-administered medications prior to visit.    Review of Systems  Constitutional: Negative for appetite change, chills and fever.  Respiratory: Negative for chest tightness, shortness of breath and wheezing.   Cardiovascular: Negative for chest pain and palpitations.  Gastrointestinal: Negative for abdominal pain, nausea and vomiting.  Musculoskeletal: Positive for back pain.       Objective    BP 124/88 (BP Location: Left Arm, Patient Position: Sitting, Cuff Size: Large)   Pulse 72   Temp (!) 96.9 F (36.1 C) (Temporal)   Resp 16   Wt 205 lb (93 kg)   SpO2 99% Comment: room air  BMI 26.32 kg/m    Physical Exam  General appearance:  Overweight male, cooperative and in no acute distress Head: Normocephalic, without obvious abnormality, atraumatic Respiratory: Respirations even and unlabored, normal respiratory rate Extremities: All extremities are intact.  Skin: Skin color, texture, turgor normal. No rashes seen  Psych: Appropriate mood and affect. Neurologic: Mental status: Alert, oriented to person, place, and time, thought content appropriate.   No results found for any visits on 04/02/20.   Assessment & Plan    1. Insomnia, unspecified type Doing well with Ambien which he takes every night.  - zolpidem (AMBIEN) 10 MG tablet; TAKE 1 TABLET BY MOUTH EVERY NIGHT AT BEDTIME AS NEEDED FOR INSOMNIA  Dispense: 30 tablet; Refill: 5  2. Family history of prostate cancer  - PSA  3. Encounter for special screening examination for cardiovascular disorder  - Comprehensive metabolic panel - Lipid panel    No follow-ups on file.      The entirety of the information documented in the History of Present Illness, Review of Systems and Physical Exam were personally obtained by me. Portions of this information were initially documented by the CMA and reviewed by me for thoroughness and accuracy.      Lelon Huh, MD  [facility] [phone] (phone) [phone] (fax)  [place]

## 2020-04-02 ENCOUNTER — Encounter: Payer: Self-pay | Admitting: Family Medicine

## 2020-04-02 ENCOUNTER — Ambulatory Visit: Payer: 59 | Admitting: Family Medicine

## 2020-04-02 ENCOUNTER — Other Ambulatory Visit: Payer: Self-pay

## 2020-04-02 VITALS — BP 124/88 | HR 72 | Temp 96.9°F | Resp 16 | Wt 205.0 lb

## 2020-04-02 DIAGNOSIS — Z8042 Family history of malignant neoplasm of prostate: Secondary | ICD-10-CM | POA: Diagnosis not present

## 2020-04-02 DIAGNOSIS — Z136 Encounter for screening for cardiovascular disorders: Secondary | ICD-10-CM | POA: Diagnosis not present

## 2020-04-02 DIAGNOSIS — G4700 Insomnia, unspecified: Secondary | ICD-10-CM

## 2020-04-02 MED ORDER — ZOLPIDEM TARTRATE 10 MG PO TABS: ORAL_TABLET | ORAL | 5 refills | Status: DC

## 2020-04-02 NOTE — Patient Instructions (Addendum)
Please go to the lab draw station in [address] on the second floor of [facility] . Normal hours are 8:00am to 12:30pm and 1:30pm to 4:00pm Monday through Friday

## 2020-04-03 LAB — LIPID PANEL
Chol/HDL Ratio: 4.8 ratio (ref 0.0–5.0)
Cholesterol, Total: 209 mg/dL — ABNORMAL HIGH (ref 100–199)
HDL: 44 mg/dL (ref >39)
LDL Chol Calc (NIH): 124 mg/dL — ABNORMAL HIGH (ref 0–99)
Triglycerides: 232 mg/dL — ABNORMAL HIGH (ref 0–149)
VLDL Cholesterol Cal: 41 mg/dL — ABNORMAL HIGH (ref 5–40)

## 2020-04-03 LAB — COMPREHENSIVE METABOLIC PANEL
ALT: 54 IU/L — ABNORMAL HIGH (ref 0–44)
AST: 26 IU/L (ref 0–40)
Albumin/Globulin Ratio: 2.1 (ref 1.2–2.2)
Albumin: 4.8 g/dL (ref 4.0–5.0)
Alkaline Phosphatase: 60 IU/L (ref 39–117)
BUN/Creatinine Ratio: 17 (ref 9–20)
BUN: 18 mg/dL (ref 6–24)
Bilirubin Total: 0.5 mg/dL (ref 0.0–1.2)
CO2: 21 mmol/L (ref 20–29)
Calcium: 9.5 mg/dL (ref 8.7–10.2)
Chloride: 101 mmol/L (ref 96–106)
Creatinine, Ser: 1.07 mg/dL (ref 0.76–1.27)
GFR calc Af Amer: 95 mL/min/{1.73_m2} (ref >59)
GFR calc non Af Amer: 82 mL/min/{1.73_m2} (ref >59)
Globulin, Total: 2.3 g/dL (ref 1.5–4.5)
Glucose: 93 mg/dL (ref 65–99)
Potassium: 4.4 mmol/L (ref 3.5–5.2)
Sodium: 138 mmol/L (ref 134–144)
Total Protein: 7.1 g/dL (ref 6.0–8.5)

## 2020-04-03 LAB — PSA: Prostate Specific Ag, Serum: 1.2 ng/mL (ref 0.0–4.0)

## 2020-09-15 ENCOUNTER — Other Ambulatory Visit: Payer: Self-pay | Admitting: Family Medicine

## 2020-09-15 DIAGNOSIS — G4700 Insomnia, unspecified: Secondary | ICD-10-CM

## 2020-09-15 NOTE — Telephone Encounter (Signed)
Requested medication (s) are due for refill today: yes  Requested medication (s) are on the active medication list: yes  Last refill:  04/02/20  Future visit scheduled: no  Notes to clinic:  med not delegated to NT   Requested Prescriptions  Pending Prescriptions Disp Refills   zolpidem (AMBIEN) 10 MG tablet [Pharmacy Med Name: ZOLPIDEM 10MG  TABLETS] 30 tablet     Sig: TAKE 1 TABLET BY MOUTH EVERY NIGHT AT BEDTIME AS NEEDED FOR INSOMNIA      Not Delegated - Psychiatry:  Anxiolytics/Hypnotics Failed - 09/15/2020  8:27 AM      Failed - This refill cannot be delegated      Failed - Urine Drug Screen completed in last 360 days.      Passed - Valid encounter within last 6 months    Recent Outpatient Visits           5 months ago Family history of prostate cancer   [facility] Birdie Sons, MD   3 years ago Insomnia, unspecified type   [facility] Birdie Sons, MD   5 years ago Rib pain on left side   [facility] Birdie Sons, MD   5 years ago Sore throat   [facility] Birdie Sons, MD

## 2021-03-01 ENCOUNTER — Other Ambulatory Visit: Payer: Self-pay | Admitting: Family Medicine

## 2021-03-01 DIAGNOSIS — G4700 Insomnia, unspecified: Secondary | ICD-10-CM

## 2021-03-02 NOTE — Telephone Encounter (Signed)
Requested medication (s) are due for refill today: no  Requested medication (s) are on the active medication list: yes  Last refill:  09/16/20  Future visit scheduled: no  Notes to clinic:  med. Not delegated to NT to RF   Requested Prescriptions  Pending Prescriptions Disp Refills   zolpidem (AMBIEN) 10 MG tablet [Pharmacy Med Name: ZOLPIDEM 10MG  TABLETS] 30 tablet     Sig: TAKE 1 TABLET BY MOUTH EVERY NIGHT AT BEDTIME AS NEEDED FOR INSOMNIA      Not Delegated - Psychiatry:  Anxiolytics/Hypnotics Failed - 03/01/2021  9:51 PM      Failed - This refill cannot be delegated      Failed - Urine Drug Screen completed in last 360 days      Failed - Valid encounter within last 6 months    Recent Outpatient Visits           11 months ago Family history of prostate cancer   [facility] Birdie Sons, MD   3 years ago Insomnia, unspecified type   [facility] Birdie Sons, MD   5 years ago Rib pain on left side   [facility] Birdie Sons, MD   5 years ago Sore throat   [facility] Birdie Sons, MD

## 2021-04-28 ENCOUNTER — Other Ambulatory Visit: Payer: Self-pay | Admitting: Family Medicine

## 2021-04-28 DIAGNOSIS — G4700 Insomnia, unspecified: Secondary | ICD-10-CM

## 2021-04-28 NOTE — Telephone Encounter (Signed)
Requested medication (s) are due for refill today: yes  Requested medication (s) are on the active medication list: yes  Last refill:  03/30/2021  Future visit scheduled: no  Notes to clinic:  this refill cannot be delegated   Requested Prescriptions  Pending Prescriptions Disp Refills   zolpidem (AMBIEN) 10 MG tablet [Pharmacy Med Name: ZOLPIDEM 10MG  TABLETS] 30 tablet     Sig: TAKE 1 TABLET BY MOUTH EVERY NIGHT AT BEDTIME AS NEEDED FOR INSOMNIA      Not Delegated - Psychiatry:  Anxiolytics/Hypnotics Failed - 04/28/2021  9:25 AM      Failed - This refill cannot be delegated      Failed - Urine Drug Screen completed in last 360 days      Failed - Valid encounter within last 6 months    Recent Outpatient Visits           1 year ago Family history of prostate cancer   [facility] Birdie Sons, MD   3 years ago Insomnia, unspecified type   [facility] Birdie Sons, MD   5 years ago Rib pain on left side   [facility] Birdie Sons, MD   5 years ago Sore throat   [facility] Birdie Sons, MD

## 2021-05-23 ENCOUNTER — Other Ambulatory Visit: Payer: Self-pay | Admitting: Family Medicine

## 2021-05-23 DIAGNOSIS — R42 Dizziness and giddiness: Secondary | ICD-10-CM

## 2021-05-28 ENCOUNTER — Other Ambulatory Visit: Payer: Self-pay | Admitting: Family Medicine

## 2021-05-28 DIAGNOSIS — G4700 Insomnia, unspecified: Secondary | ICD-10-CM

## 2021-05-28 NOTE — Telephone Encounter (Signed)
Requested medication (s) are due for refill today: yes  requested medication (s) are on the active medication list: yes  Last refill:  04/28/2021  Future visit scheduled: no  Notes to clinic: this refill cannot be delegated    Requested Prescriptions  Pending Prescriptions Disp Refills   zolpidem (AMBIEN) 10 MG tablet [Pharmacy Med Name: ZOLPIDEM 10MG  TABLETS] 30 tablet     Sig: TAKE 1 TABLET(10 MG) BY MOUTH AT BEDTIME AS NEEDED FOR SLEEP      Not Delegated - Psychiatry:  Anxiolytics/Hypnotics Failed - 05/28/2021  1:51 PM      Failed - This refill cannot be delegated      Failed - Urine Drug Screen completed in last 360 days      Failed - Valid encounter within last 6 months    Recent Outpatient Visits           1 year ago Family history of prostate cancer   [facility] Birdie Sons, MD   3 years ago Insomnia, unspecified type   [facility] Birdie Sons, MD   5 years ago Rib pain on left side   [facility] Birdie Sons, MD   6 years ago Sore throat   [facility] Birdie Sons, MD

## 2021-06-06 ENCOUNTER — Ambulatory Visit: Payer: 59

## 2021-09-16 ENCOUNTER — Other Ambulatory Visit: Payer: Self-pay | Admitting: Family Medicine

## 2021-09-16 DIAGNOSIS — G4700 Insomnia, unspecified: Secondary | ICD-10-CM

## 2022-01-30 ENCOUNTER — Other Ambulatory Visit: Payer: Self-pay | Admitting: Family Medicine

## 2022-01-30 DIAGNOSIS — G4700 Insomnia, unspecified: Secondary | ICD-10-CM

## 2023-08-27 ENCOUNTER — Other Ambulatory Visit: Payer: Self-pay | Admitting: Family Medicine

## 2023-08-27 DIAGNOSIS — E782 Mixed hyperlipidemia: Secondary | ICD-10-CM

## 2023-09-07 ENCOUNTER — Ambulatory Visit
Admission: RE | Admit: 2023-09-07 | Discharge: 2023-09-07 | Disposition: A | Discharge status: Home / Self Care | Payer: No Typology Code available for payment source | Source: Ambulatory Visit | Attending: Family Medicine | Admitting: Family Medicine

## 2023-09-07 DIAGNOSIS — E782 Mixed hyperlipidemia: Secondary | ICD-10-CM | POA: Insufficient documentation

## 2023-11-30 ENCOUNTER — Other Ambulatory Visit: Payer: Self-pay | Admitting: Family Medicine

## 2023-11-30 DIAGNOSIS — R7401 Elevation of levels of liver transaminase levels: Secondary | ICD-10-CM

## 2023-12-02 ENCOUNTER — Ambulatory Visit
Admission: RE | Admit: 2023-12-02 | Discharge: 2023-12-02 | Disposition: A | Discharge status: Home / Self Care | Payer: No Typology Code available for payment source | Source: Ambulatory Visit | Attending: Family Medicine | Admitting: Family Medicine

## 2023-12-02 DIAGNOSIS — R7401 Elevation of levels of liver transaminase levels: Secondary | ICD-10-CM | POA: Insufficient documentation

## 2024-04-17 ENCOUNTER — Ambulatory Visit: Payer: Self-pay

## 2024-04-17 DIAGNOSIS — Z1211 Encounter for screening for malignant neoplasm of colon: Secondary | ICD-10-CM | POA: Diagnosis present

## 2024-04-17 DIAGNOSIS — D128 Benign neoplasm of rectum: Secondary | ICD-10-CM | POA: Diagnosis not present

## 2024-04-17 DIAGNOSIS — D122 Benign neoplasm of ascending colon: Secondary | ICD-10-CM | POA: Diagnosis not present
